# Patient Record
Sex: Male | Born: 1945 | Hispanic: No | Smoking: Former smoker
Health system: Southern US, Community
[De-identification: ages and names within clinical notes are randomized; demographics above are authoritative.]

## PROBLEM LIST (undated history)

## (undated) DIAGNOSIS — I10 Essential (primary) hypertension: Secondary | ICD-10-CM

## (undated) HISTORY — PX: CARDIAC CATHETERIZATION: SHX172

## (undated) HISTORY — PX: HERNIA REPAIR: SHX51

---

## 2018-12-12 ENCOUNTER — Emergency Department
Admission: EM | Admit: 2018-12-12 | Discharge: 2018-12-12 | Disposition: A | Discharge status: Home / Self Care | Payer: Self-pay | Attending: Emergency Medicine | Admitting: Emergency Medicine

## 2018-12-12 ENCOUNTER — Encounter: Payer: Self-pay | Admitting: Emergency Medicine

## 2018-12-12 ENCOUNTER — Emergency Department: Payer: Self-pay

## 2018-12-12 ENCOUNTER — Other Ambulatory Visit: Payer: Self-pay

## 2018-12-12 DIAGNOSIS — R42 Dizziness and giddiness: Secondary | ICD-10-CM | POA: Insufficient documentation

## 2018-12-12 DIAGNOSIS — Z20828 Contact with and (suspected) exposure to other viral communicable diseases: Secondary | ICD-10-CM | POA: Insufficient documentation

## 2018-12-12 DIAGNOSIS — I10 Essential (primary) hypertension: Secondary | ICD-10-CM | POA: Insufficient documentation

## 2018-12-12 DIAGNOSIS — Z87891 Personal history of nicotine dependence: Secondary | ICD-10-CM | POA: Insufficient documentation

## 2018-12-12 DIAGNOSIS — J329 Chronic sinusitis, unspecified: Secondary | ICD-10-CM | POA: Insufficient documentation

## 2018-12-12 DIAGNOSIS — J449 Chronic obstructive pulmonary disease, unspecified: Secondary | ICD-10-CM | POA: Insufficient documentation

## 2018-12-12 HISTORY — DX: Essential (primary) hypertension: I10

## 2018-12-12 LAB — CBC
HCT: 46.8 % (ref 39.0–52.0)
Hemoglobin: 15.6 g/dL (ref 13.0–17.0)
MCH: 28.5 pg (ref 26.0–34.0)
MCHC: 33.3 g/dL (ref 30.0–36.0)
MCV: 85.4 fL (ref 80.0–100.0)
Platelets: 247 10*3/uL (ref 150–400)
RBC: 5.48 MIL/uL (ref 4.22–5.81)
RDW: 12.9 % (ref 11.5–15.5)
WBC: 6.2 10*3/uL (ref 4.0–10.5)
nRBC: 0 % (ref 0.0–0.2)

## 2018-12-12 LAB — BASIC METABOLIC PANEL
Anion gap: 12 (ref 5–15)
BUN: 15 mg/dL (ref 8–23)
CO2: 22 mmol/L (ref 22–32)
Calcium: 9.3 mg/dL (ref 8.9–10.3)
Chloride: 100 mmol/L (ref 98–111)
Creatinine, Ser: 0.81 mg/dL (ref 0.61–1.24)
GFR calc Af Amer: >60 mL/min (ref >60)
GFR calc non Af Amer: >60 mL/min (ref >60)
Glucose, Bld: 106 mg/dL — ABNORMAL HIGH (ref 70–99)
Potassium: 3.3 mmol/L — ABNORMAL LOW (ref 3.5–5.1)
Sodium: 134 mmol/L — ABNORMAL LOW (ref 135–145)

## 2018-12-12 MED ORDER — ACETAMINOPHEN 325 MG PO TABS
650.0000 mg | ORAL_TABLET | Freq: Once | ORAL | Status: AC
  Administered 2018-12-12: 650 mg via ORAL
  Filled 2018-12-12: qty 2

## 2018-12-12 MED ORDER — PREDNISONE 10 MG PO TABS: 40.0000 mg | ORAL_TABLET | Freq: Every day | ORAL | 0 refills | Status: AC

## 2018-12-12 MED ORDER — SODIUM CHLORIDE 0.9 % IV BOLUS
500.0000 mL | Freq: Once | INTRAVENOUS | Status: AC
  Administered 2018-12-12: 500 mL via INTRAVENOUS

## 2018-12-12 NOTE — Discharge Instructions (Addendum)
Please call and schedule a follow up appointment with primary care. The chest x-ray shows only chronic bronchitis. Your CT scan shows some evidence of sinusitis, which may cause a headache.  Prednisone will help with the sinusitis and chronic bronchitis as well. If the shortness of breath gets worse or there are any emergent concerns, return to the emergency department.

## 2018-12-12 NOTE — ED Triage Notes (Signed)
Pt presents to ED c/o SOB and dizziness x2 days with BP 180/85. States BP is typically around 140/90. C/o headache 5/10.

## 2018-12-12 NOTE — ED Notes (Signed)
Butch at bedside to start IV, orthostatic vitals and start fluids. Pt and family updated on plan and that as soon as MD is out of another room we will provide another update.

## 2018-12-14 LAB — NOVEL CORONAVIRUS, NAA (HOSP ORDER, SEND-OUT TO REF LAB; TAT 18-24 HRS): SARS-CoV-2, NAA: NOT DETECTED

## 2018-12-14 NOTE — ED Provider Notes (Signed)
[facility] Emergency Department Provider Note  ____________________________________________   None    (approximate)  I have reviewed the triage vital signs and the nursing notes.   HISTORY  Chief Complaint Shortness of Breath and Hypertension   HPI Darrell Sullivan is a 73 y.o. male who presents to the emergency department for treatment and evaluation of shortness of breath with dizziness for the past 2 days and hypertension.  Son states that he has recently had some family issues that have caused him increased stress.  Patient denies any chest pain.  Dizziness started about the same time he noticed the blood pressure was increased.     Past Medical History:  Diagnosis Date  . Hypertension     There are no active problems to display for this patient.   Past Surgical History:  Procedure Laterality Date  . CARDIAC CATHETERIZATION     x3 stents  . HERNIA REPAIR      Prior to Admission medications   Medication Sig Start Date End Date Taking? Authorizing Provider  predniSONE (DELTASONE) 10 MG tablet Take 4 tablets (40 mg total) by mouth daily for 5 days. 12/12/18 12/17/18  Chinita Pesterriplett, Marshal Eskew B, FNP    Allergies Patient has no known allergies.  History reviewed. No pertinent family history.  Social History Social History   Tobacco Use  . Smoking status: Former Smoker    Types: Cigarettes    Quit date: 2005    Years since quitting: 15.5  . Smokeless tobacco: Never Used  Substance Use Topics  . Alcohol use: Not Currently  . Drug use: Never    Review of Systems  Constitutional: No fever/chills. Eyes: No visual changes. ENT: No sore throat. Cardiovascular: Denies chest pain. Respiratory: Positive for shortness of breath. Gastrointestinal: No abdominal pain.  No nausea, no vomiting.  No diarrhea.  No constipation. Genitourinary: Negative for dysuria. Musculoskeletal: Negative for back pain. Skin: Negative for rash. Neurological:  Positive for headaches, negative for focal weakness or numbness. ____________________________________________   PHYSICAL EXAM:  VITAL SIGNS: ED Triage Vitals  Enc Vitals Group     BP 12/12/18 1509 (!) 178/74     Pulse Rate 12/12/18 1509 88     Resp 12/12/18 1509 18     Temp 12/12/18 1509 97.9 F (36.6 C)     Temp Source 12/12/18 1509 Oral     SpO2 12/12/18 1509 100 %     Weight 12/12/18 1507 160 lb (72.6 kg)     Height 12/12/18 1507 5\' 10"  (1.778 m)     Head Circumference --      Peak Flow --      Pain Score 12/12/18 1506 5     Pain Loc --      Pain Edu? --      Excl. in GC? --     Constitutional: Alert and oriented. Well appearing and in no acute distress. Eyes: Conjunctivae are normal. PERRL. EOMI. Head: Atraumatic. Nose: No congestion/rhinnorhea. Mouth/Throat: Mucous membranes are moist.  Oropharynx non-erythematous. Neck: No stridor.   Cardiovascular: Normal rate, regular rhythm. Grossly normal heart sounds.  Good peripheral circulation. Respiratory: Normal respiratory effort.  No retractions. Lungs CTAB. Gastrointestinal: Soft and nontender. No distention. No abdominal bruits. No CVA tenderness. Musculoskeletal: No lower extremity tenderness nor edema.  No joint effusions. Neurologic:  Normal speech and language. No gross focal neurologic deficits are appreciated. No gait instability. Skin:  Skin is warm, dry and intact. No rash noted. Psychiatric: Mood and affect are  normal. Speech and behavior are normal.  ____________________________________________   LABS (all labs ordered are listed, but only abnormal results are displayed)  Labs Reviewed  BASIC METABOLIC PANEL - Abnormal; Notable for the following components:      Result Value   Sodium 134 (*)    Potassium 3.3 (*)    Glucose, Bld 106 (*)    All other components within normal limits  NOVEL CORONAVIRUS, NAA (HOSPITAL ORDER, SEND-OUT TO REF LAB)  CBC   ____________________________________________  EKG   ED ECG REPORT I, Abigail Teall, FNP-BC personally viewed and interpreted this ECG.   Date: 12/14/2018  EKG Time: 1508  Rate: 78  Rhythm: normal EKG, normal sinus rhythm  Axis: normal  Intervals:none  ST&T Change: no ST elevation  ____________________________________________  RADIOLOGY  ED MD interpretation:  CT head indicates sinusitis, otherwise normal.   Chest x-ray shows evidence of COPD  Official radiology report(s): No results found.  ____________________________________________   PROCEDURES  Procedure(s) performed (including Critical Care):  Procedures   ____________________________________________   INITIAL IMPRESSION / ASSESSMENT AND PLAN / ED COURSE  As part of my medical decision making, I reviewed the following data within the [place] from family.  73 year old male presents for concerns of hypertension and dizziness. He is overall well appearing. Son interprets for him. Son states that there has been some tension in the family as of late and since then, patient's blood pressure has been elevated. He denies chest pain. He has complained of shortness of breath during times of being upset.  Sodium and potassium are slightly low, otherwise labs are reassuring. Orthostatics as well as CT of the head is unremarkable with the exception of left maxillary air-sinus fluid level . Chest x-ray consistent with COPD. Patient to be discharged home to follow up with his PCP for further management of hypertension as well as COPD and sinusitis. Prednisone taper prescribed for COPD and sinusitis. Hypertension is likely related to stress.   Return precautions discussed. They are to return to the ER for any change or worsening symptoms.             ____________________________________________   FINAL CLINICAL IMPRESSION(S) / ED DIAGNOSES  Final diagnoses:  Hypertension, unspecified type  Dizziness  Chronic obstructive pulmonary disease,  unspecified COPD type ([place])  Sinusitis, unspecified chronicity, unspecified location     ED Discharge Orders         Ordered    predniSONE (DELTASONE) 10 MG tablet  Daily     12/12/18 1943           Note:  This document was prepared using Dragon voice recognition software and may include unintentional dictation errors.    Victorino Dike, FNP 12/16/18 1206    Nena Polio, MD 12/23/18 2119

## 2018-12-18 ENCOUNTER — Telehealth: Payer: Self-pay | Admitting: Emergency Medicine

## 2018-12-18 NOTE — Telephone Encounter (Signed)
Called patient with arabic interpretter [id number].  She left a message to call me back.

## 2019-11-11 IMAGING — CR CHEST - 2 VIEW
2 series · 2 of 2 positions shown · non-contrast
Comparison: None.

CLINICAL DATA: Shortness of breath for 2 days.

EXAM:
CHEST - 2 VIEW

[chest pa]
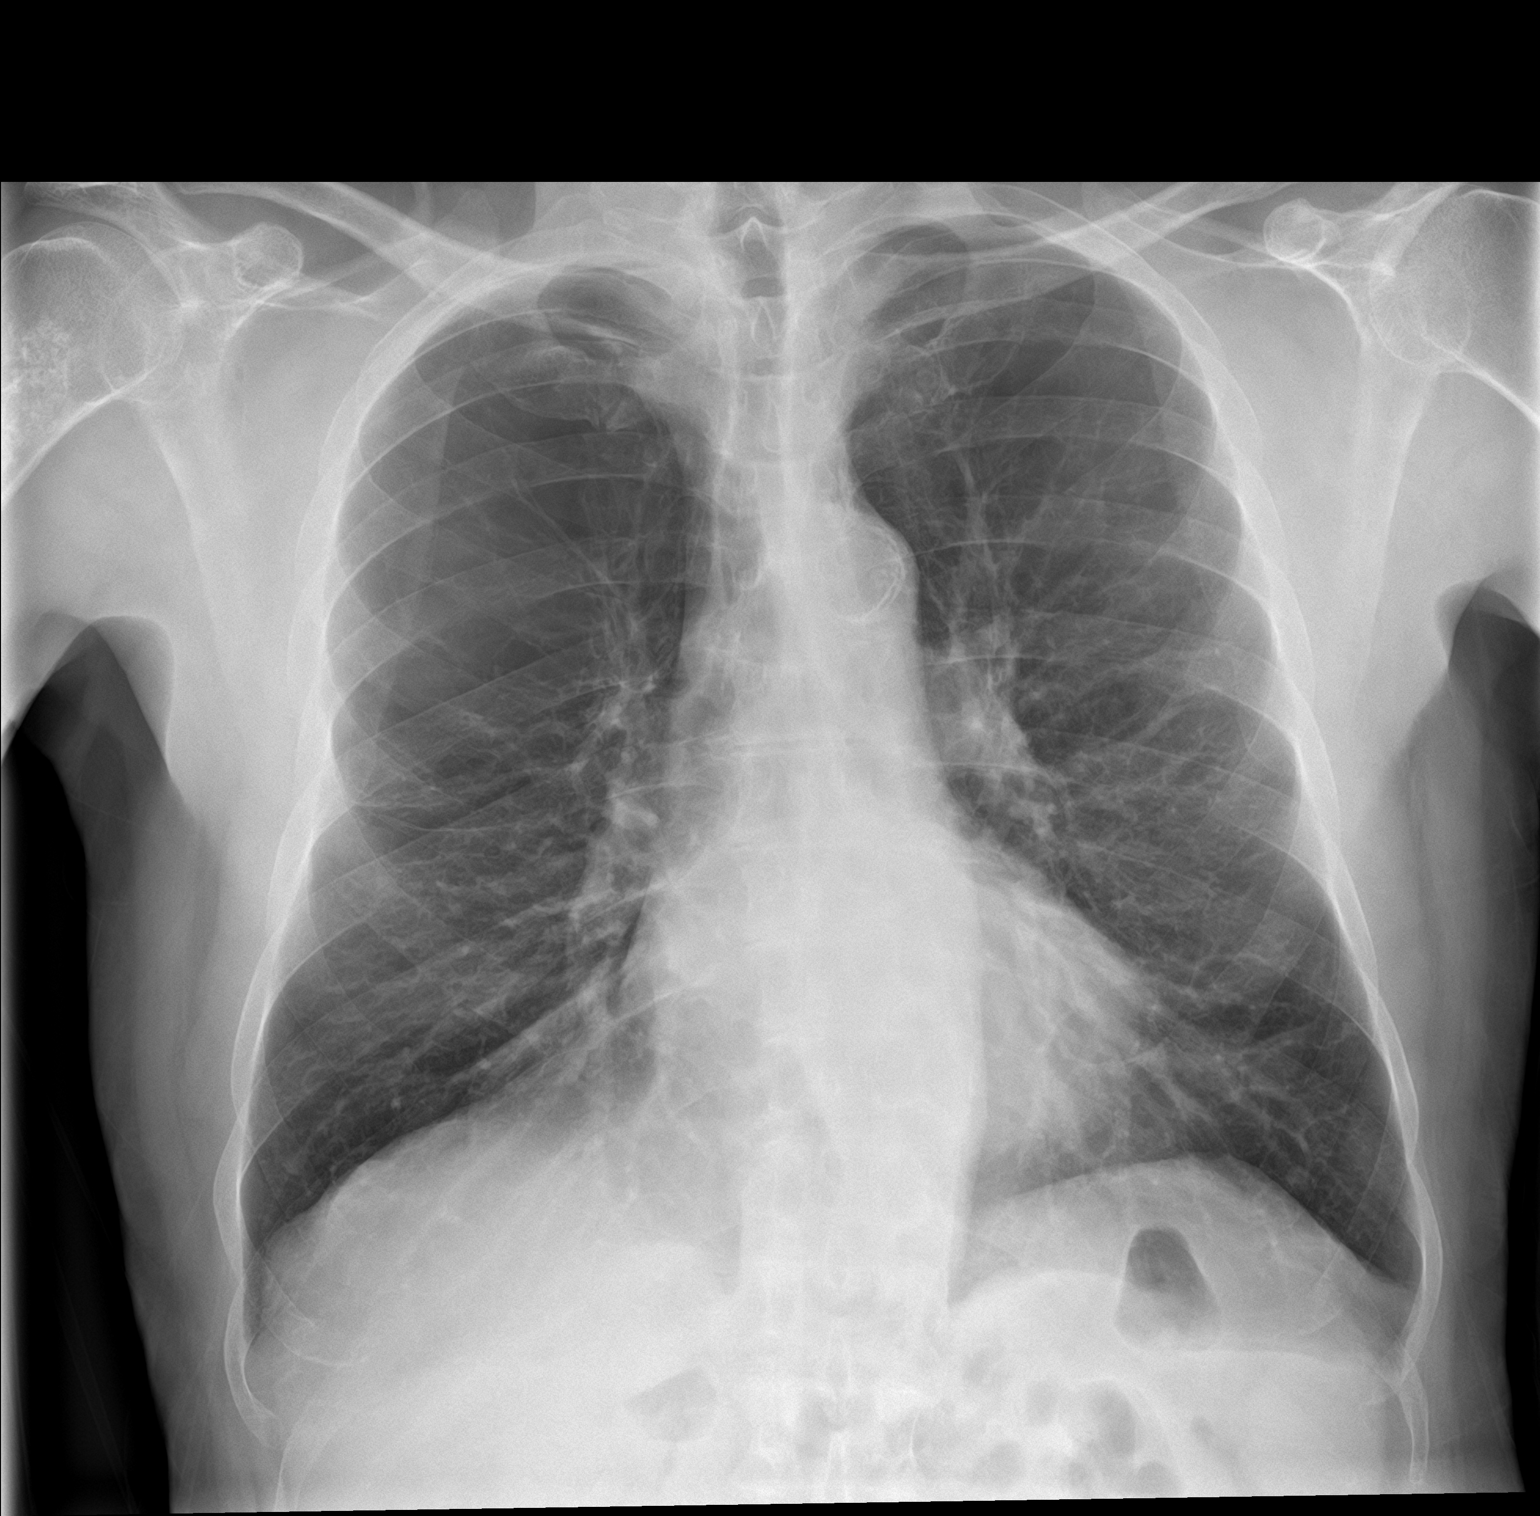

[chest lat]
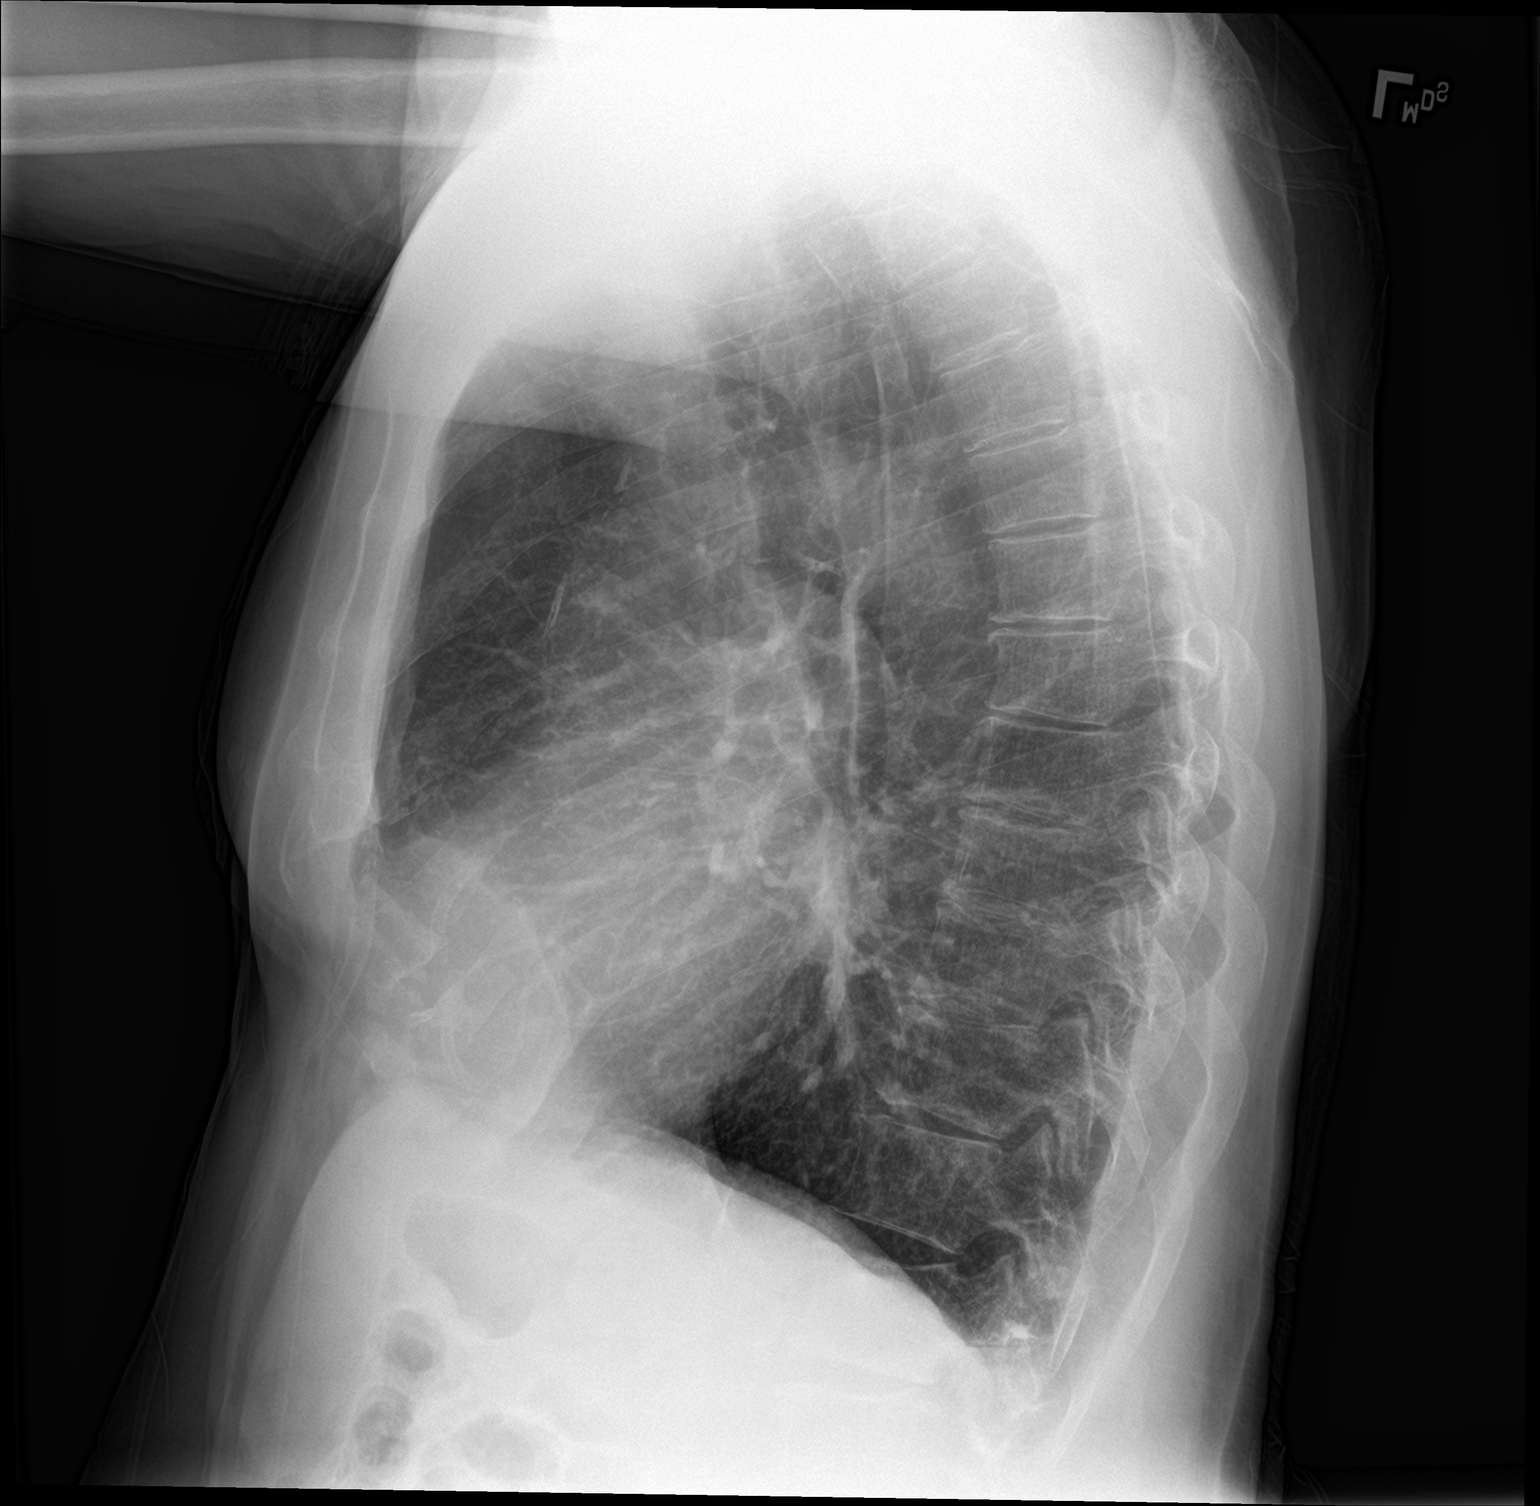

[2 of 2 positions shown; findings below may reference images not displayed]

FINDINGS: The heart size and mediastinal contours are within normal limits.
The lungs are hyperinflated. There is no focal infiltrate, pulmonary
edema, or pleural effusion. Increased sclerotic density is
identified in the proximal visualized right humerus. Evaluation is
limited as this is incompletely included.
IMPRESSION: No active cardiopulmonary disease.

COPD.

## 2019-11-11 IMAGING — CT CT HEAD WITHOUT CONTRAST
3 series · 16 of 46 positions shown, 19 images · non-contrast
Comparison: None.

CLINICAL DATA: Headache and dizziness.

EXAM:
CT HEAD WITHOUT CONTRAST
TECHNIQUE: Contiguous axial images were obtained from the base of the skull
through the vertex without intravenous contrast.

[Series 2: head wo · axial · 0.47mm/px · z∈[-142,-22]mm · 10 of 29 slices shown, 13 images]
[im 3/29  brain]
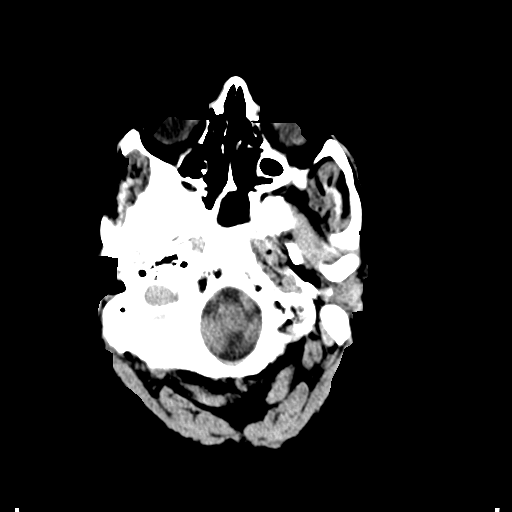
[im 3/29  bone]
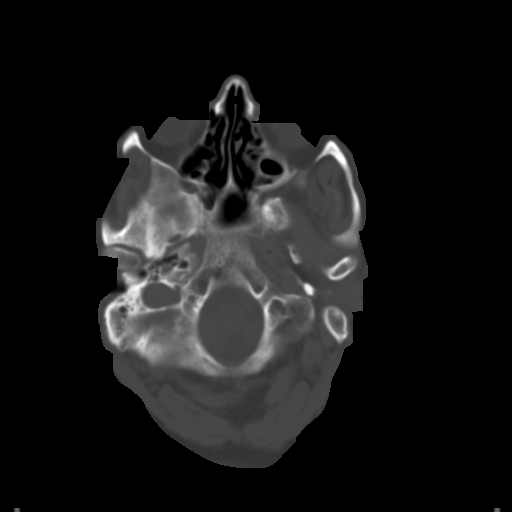
[im 6/29  brain]
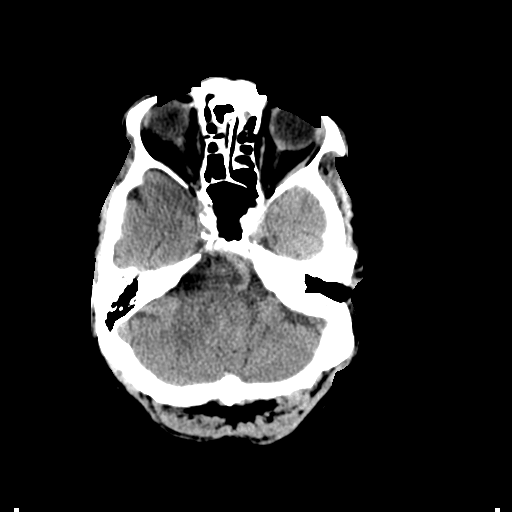
[im 8/29  brain]
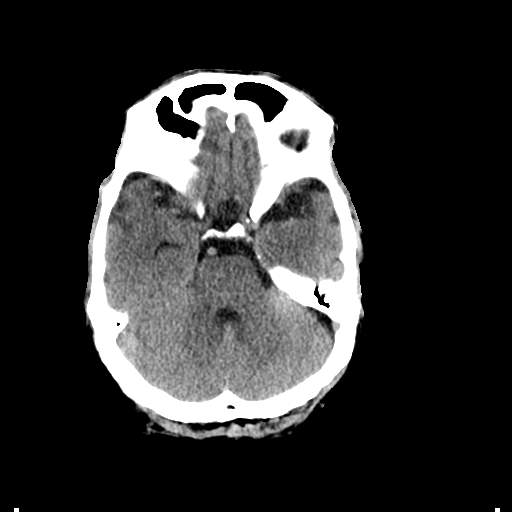
[im 11/29  brain]
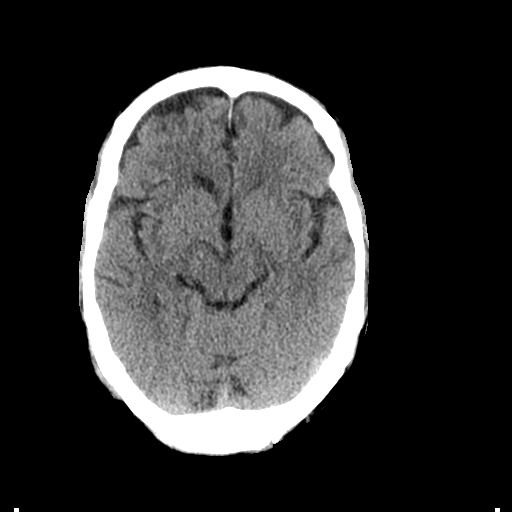
[im 14/29  brain]
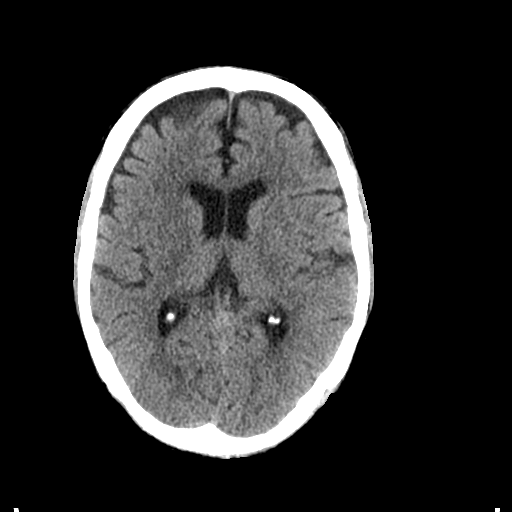
[im 14/29  bone]
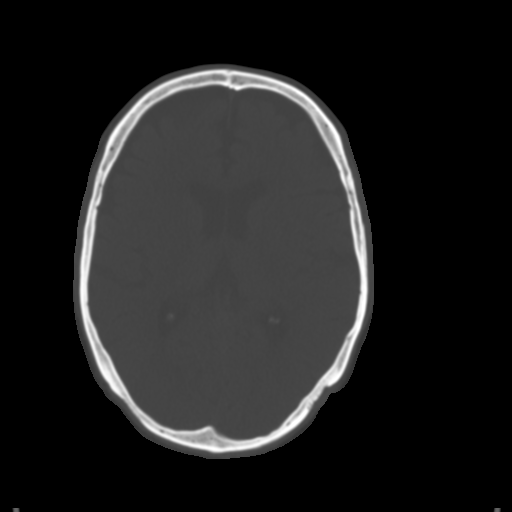
[im 16/29  brain]
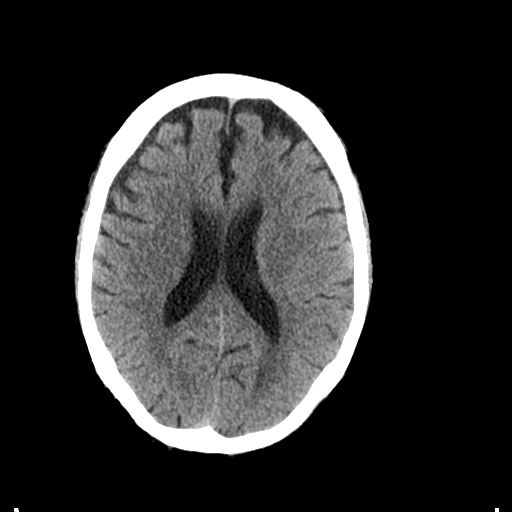
[im 19/29  brain]
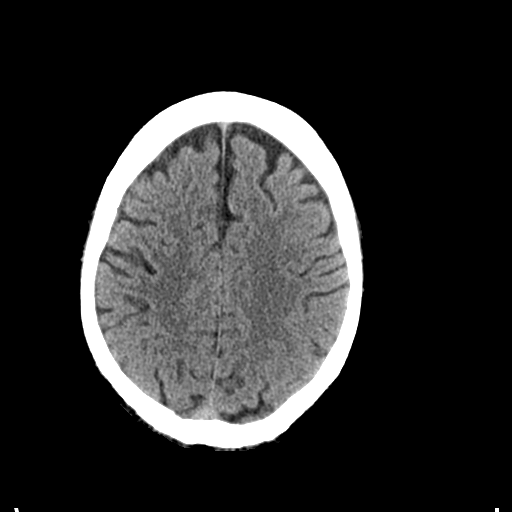
[im 22/29  brain]
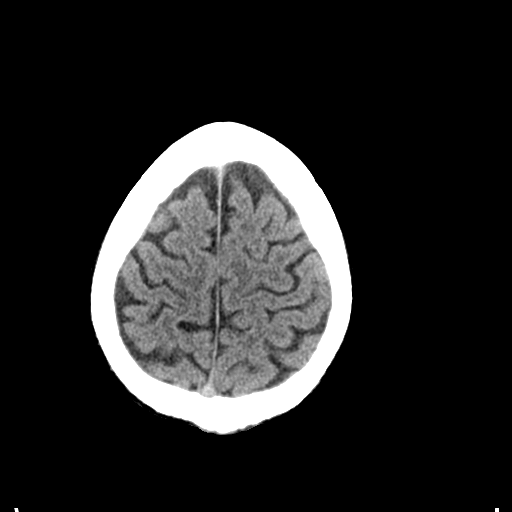
[im 24/29  brain]
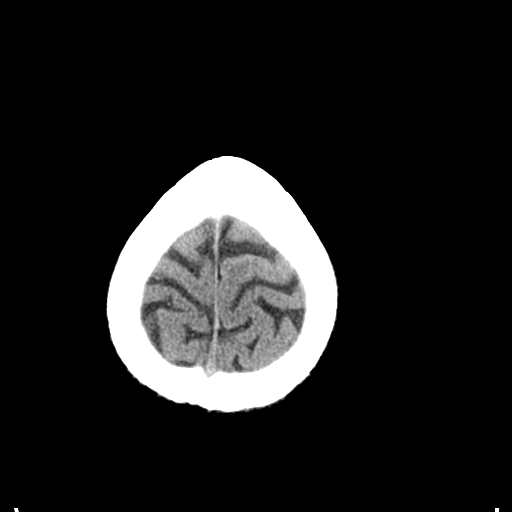
[im 24/29  bone]
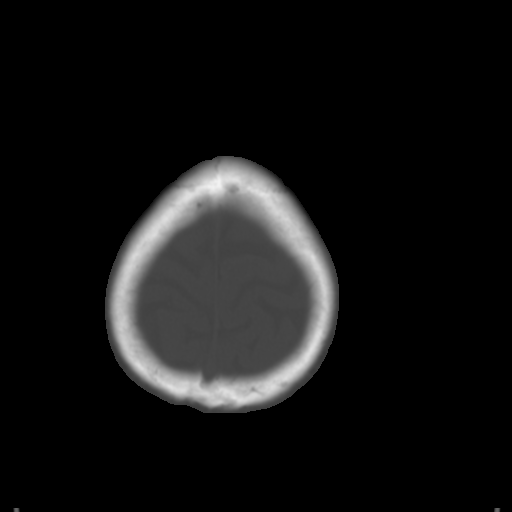
[im 27/29  brain]
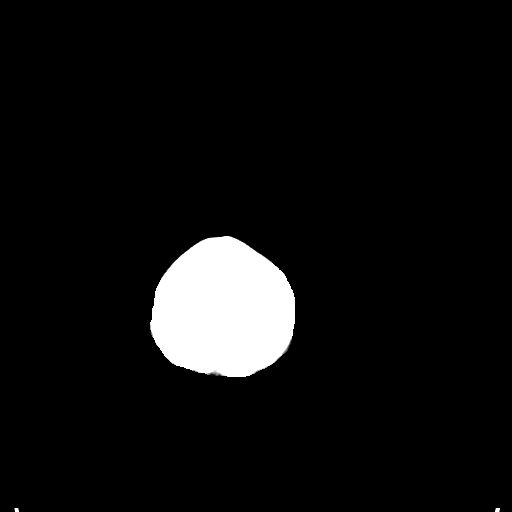

[Series 4: coronal soft tissue · coronal · 0.30mm/px · 3 of 63 slices shown]
[im 21/63  brain]
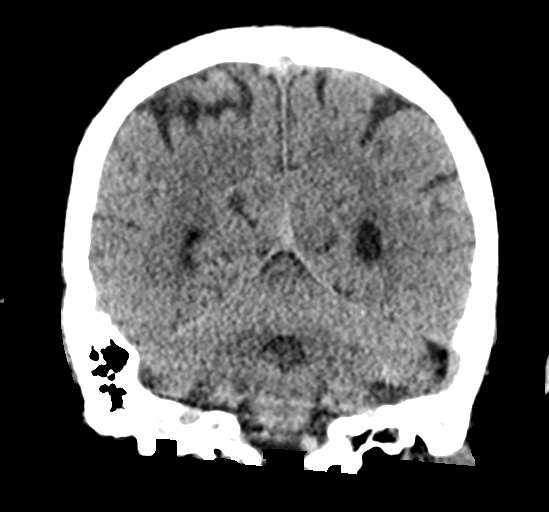
[im 28/63  brain]
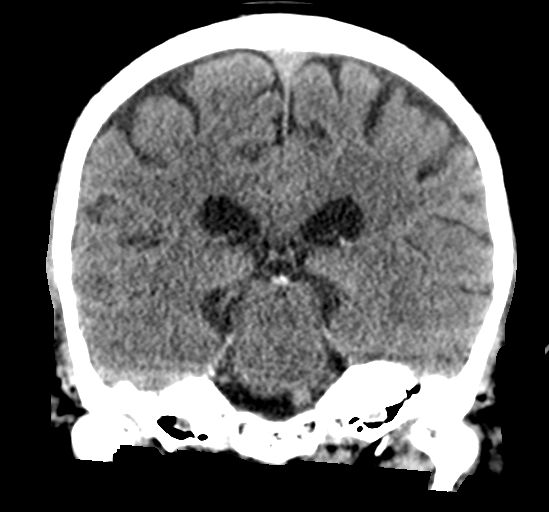
[im 35/63  brain]
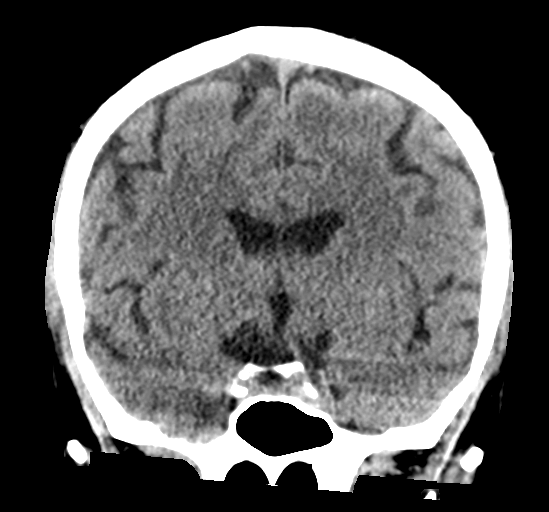

[Series 5: sagittal soft tissue · sagittal · 0.30mm/px · 3 of 55 slices shown]
[im 19/55  brain]
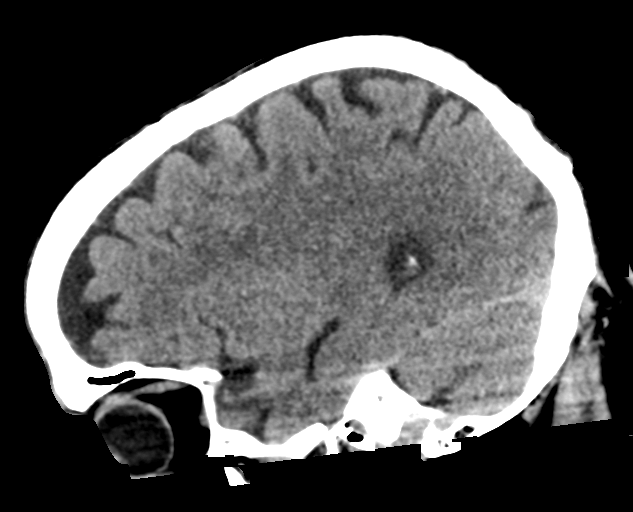
[im 28/55  brain]
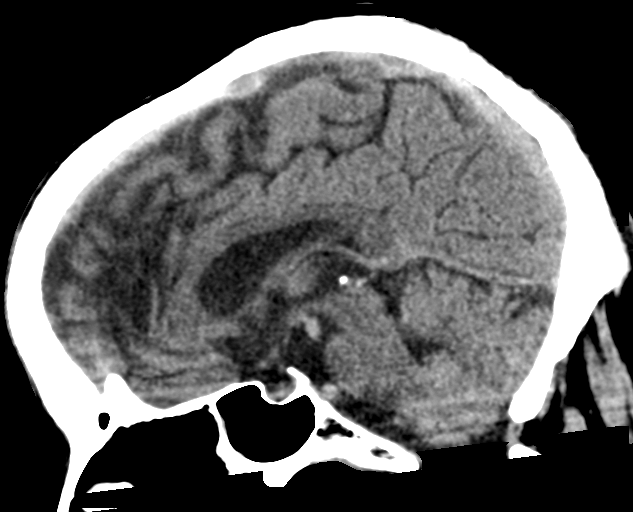
[im 37/55  brain]
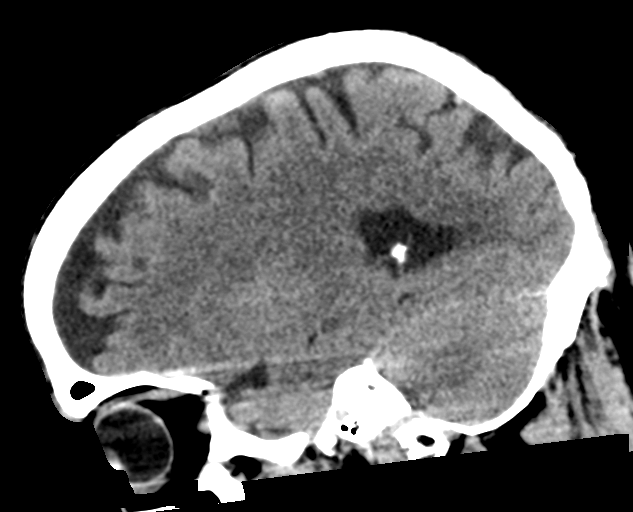

[16 of 46 positions shown; findings below may reference images not displayed]

FINDINGS: Brain: No evidence of acute infarction, hemorrhage, hydrocephalus,
extra-axial collection or mass lesion/mass effect. Mild generalized
cerebral atrophy, expected for age.

Vascular: Atherosclerotic vascular calcification of the carotid
siphons. No hyperdense vessel.

Skull: Normal. Negative for fracture or focal lesion.

Sinuses/Orbits: Scattered mild paranasal sinus mucosal thickening
with small air-fluid level in the left maxillary sinus and frothy
secretions in left sphenoid sinus. The mastoid air cells are clear.
The orbits are unremarkable.

Other: None.
IMPRESSION: 1.  No acute intracranial abnormality.
2. Paranasal sinus disease with small air-fluid level in the left
maxillary sinus. Correlate for acute sinusitis.
